# Patient Record
Sex: Male | Born: 1960 | Race: Black or African American | Hispanic: No | Marital: Single | Smoking: Current every day smoker
Health system: Southern US, Community
[De-identification: ages and names within clinical notes are randomized; demographics above are authoritative.]

## PROBLEM LIST (undated history)

## (undated) DIAGNOSIS — I82409 Acute embolism and thrombosis of unspecified deep veins of unspecified lower extremity: Secondary | ICD-10-CM

## (undated) DIAGNOSIS — M779 Enthesopathy, unspecified: Secondary | ICD-10-CM

## (undated) HISTORY — PX: HERNIA REPAIR: SHX51

## (undated) HISTORY — PX: KNEE ARTHROPLASTY: SHX992

---

## 2007-08-26 ENCOUNTER — Emergency Department (HOSPITAL_COMMUNITY): Admission: EM | Admit: 2007-08-26 | Discharge: 2007-08-26 | Payer: Self-pay | Admitting: Emergency Medicine

## 2007-10-11 ENCOUNTER — Emergency Department (HOSPITAL_COMMUNITY): Admission: EM | Admit: 2007-10-11 | Discharge: 2007-10-11 | Payer: Self-pay | Admitting: Emergency Medicine

## 2007-10-22 ENCOUNTER — Emergency Department: Payer: Self-pay | Admitting: Emergency Medicine

## 2007-10-22 ENCOUNTER — Other Ambulatory Visit: Payer: Self-pay

## 2007-11-13 ENCOUNTER — Emergency Department: Payer: Self-pay | Admitting: Emergency Medicine

## 2007-11-13 ENCOUNTER — Other Ambulatory Visit: Payer: Self-pay

## 2008-09-19 ENCOUNTER — Inpatient Hospital Stay (HOSPITAL_COMMUNITY): Admission: EM | Admit: 2008-09-19 | Discharge: 2008-09-21 | Payer: Self-pay | Admitting: Emergency Medicine

## 2008-09-20 ENCOUNTER — Ambulatory Visit: Payer: Self-pay | Admitting: Internal Medicine

## 2008-09-21 ENCOUNTER — Ambulatory Visit: Payer: Self-pay | Admitting: Gastroenterology

## 2008-09-23 ENCOUNTER — Encounter: Payer: Self-pay | Admitting: Internal Medicine

## 2008-10-29 ENCOUNTER — Emergency Department (HOSPITAL_COMMUNITY): Admission: EM | Admit: 2008-10-29 | Discharge: 2008-10-29 | Payer: Self-pay | Admitting: Emergency Medicine

## 2008-10-30 ENCOUNTER — Encounter (INDEPENDENT_AMBULATORY_CARE_PROVIDER_SITE_OTHER): Payer: Self-pay | Admitting: *Deleted

## 2008-11-26 ENCOUNTER — Ambulatory Visit: Payer: Self-pay | Admitting: Internal Medicine

## 2008-11-26 DIAGNOSIS — R1013 Epigastric pain: Secondary | ICD-10-CM

## 2008-11-26 DIAGNOSIS — R1011 Right upper quadrant pain: Secondary | ICD-10-CM | POA: Insufficient documentation

## 2008-11-26 DIAGNOSIS — K219 Gastro-esophageal reflux disease without esophagitis: Secondary | ICD-10-CM

## 2008-11-26 DIAGNOSIS — R112 Nausea with vomiting, unspecified: Secondary | ICD-10-CM

## 2008-11-27 ENCOUNTER — Encounter (HOSPITAL_COMMUNITY): Admission: RE | Admit: 2008-11-27 | Discharge: 2008-12-27 | Payer: Self-pay | Admitting: Internal Medicine

## 2008-12-03 ENCOUNTER — Encounter (INDEPENDENT_AMBULATORY_CARE_PROVIDER_SITE_OTHER): Payer: Self-pay

## 2008-12-10 ENCOUNTER — Emergency Department (HOSPITAL_COMMUNITY): Admission: EM | Admit: 2008-12-10 | Discharge: 2008-12-10 | Payer: Self-pay | Admitting: Emergency Medicine

## 2008-12-11 ENCOUNTER — Encounter (INDEPENDENT_AMBULATORY_CARE_PROVIDER_SITE_OTHER): Payer: Self-pay | Admitting: *Deleted

## 2009-02-04 ENCOUNTER — Encounter (INDEPENDENT_AMBULATORY_CARE_PROVIDER_SITE_OTHER): Payer: Self-pay | Admitting: *Deleted

## 2009-05-02 ENCOUNTER — Emergency Department (HOSPITAL_COMMUNITY): Admission: EM | Admit: 2009-05-02 | Discharge: 2009-05-02 | Payer: Self-pay | Admitting: Emergency Medicine

## 2009-12-24 ENCOUNTER — Emergency Department (HOSPITAL_COMMUNITY): Admission: EM | Admit: 2009-12-24 | Discharge: 2009-12-24 | Payer: Self-pay | Admitting: Emergency Medicine

## 2010-03-23 ENCOUNTER — Emergency Department (HOSPITAL_COMMUNITY): Admission: EM | Admit: 2010-03-23 | Discharge: 2010-03-23 | Payer: Self-pay | Admitting: Emergency Medicine

## 2010-09-16 ENCOUNTER — Emergency Department (HOSPITAL_COMMUNITY)
Admission: EM | Admit: 2010-09-16 | Discharge: 2010-09-16 | Disposition: A | Discharge status: Home / Self Care | Payer: Self-pay | Attending: Emergency Medicine | Admitting: Emergency Medicine

## 2010-09-16 DIAGNOSIS — Z8639 Personal history of other endocrine, nutritional and metabolic disease: Secondary | ICD-10-CM | POA: Insufficient documentation

## 2010-09-16 DIAGNOSIS — R109 Unspecified abdominal pain: Secondary | ICD-10-CM | POA: Insufficient documentation

## 2010-09-16 DIAGNOSIS — K219 Gastro-esophageal reflux disease without esophagitis: Secondary | ICD-10-CM | POA: Insufficient documentation

## 2010-09-16 DIAGNOSIS — Z862 Personal history of diseases of the blood and blood-forming organs and certain disorders involving the immune mechanism: Secondary | ICD-10-CM | POA: Insufficient documentation

## 2010-09-16 DIAGNOSIS — R112 Nausea with vomiting, unspecified: Secondary | ICD-10-CM | POA: Insufficient documentation

## 2010-09-16 LAB — CBC
HCT: 44.2 % (ref 39.0–52.0)
MCH: 29.9 pg (ref 26.0–34.0)
MCV: 83.1 fL (ref 78.0–100.0)
Platelets: 246 10*3/uL (ref 150–400)
RBC: 5.32 MIL/uL (ref 4.22–5.81)
WBC: 12.6 10*3/uL — ABNORMAL HIGH (ref 4.0–10.5)

## 2010-09-16 LAB — DIFFERENTIAL
Basophils Absolute: 0 10*3/uL (ref 0.0–0.1)
Basophils Relative: 0 % (ref 0–1)
Eosinophils Relative: 0 % (ref 0–5)
Lymphocytes Relative: 8 % — ABNORMAL LOW (ref 12–46)
Monocytes Relative: 3 % (ref 3–12)
Neutro Abs: 11.2 10*3/uL — ABNORMAL HIGH (ref 1.7–7.7)
Neutrophils Relative %: 89 % — ABNORMAL HIGH (ref 43–77)

## 2010-09-16 LAB — BASIC METABOLIC PANEL
BUN: 10 mg/dL (ref 6–23)
Chloride: 102 mEq/L (ref 96–112)
GFR calc Af Amer: >60 mL/min (ref >60)
Potassium: 3.8 mEq/L (ref 3.5–5.1)
Sodium: 136 mEq/L (ref 135–145)

## 2010-09-22 LAB — URINALYSIS, ROUTINE W REFLEX MICROSCOPIC
Ketones, ur: NEGATIVE mg/dL
Nitrite: NEGATIVE
Urobilinogen, UA: 0.2 mg/dL (ref 0.0–1.0)

## 2010-09-25 LAB — COMPREHENSIVE METABOLIC PANEL
Albumin: 4.3 g/dL (ref 3.5–5.2)
CO2: 24 mEq/L (ref 19–32)
Glucose, Bld: 109 mg/dL — ABNORMAL HIGH (ref 70–99)
Potassium: 3.7 mEq/L (ref 3.5–5.1)
Sodium: 139 mEq/L (ref 135–145)

## 2010-09-25 LAB — LIPASE, BLOOD: Lipase: 22 U/L (ref 11–59)

## 2010-09-25 LAB — DIFFERENTIAL
Basophils Absolute: 0 10*3/uL (ref 0.0–0.1)
Basophils Relative: 0 % (ref 0–1)
Eosinophils Absolute: 0 10*3/uL (ref 0.0–0.7)
Lymphocytes Relative: 9 % — ABNORMAL LOW (ref 12–46)
Lymphs Abs: 0.9 10*3/uL (ref 0.7–4.0)
Monocytes Relative: 2 % — ABNORMAL LOW (ref 3–12)
Neutro Abs: 8.5 10*3/uL — ABNORMAL HIGH (ref 1.7–7.7)
Neutrophils Relative %: 88 % — ABNORMAL HIGH (ref 43–77)

## 2010-09-25 LAB — CBC
Hemoglobin: 14.4 g/dL (ref 13.0–17.0)
MCV: 89.5 fL (ref 78.0–100.0)
RDW: 13.8 % (ref 11.5–15.5)
WBC: 9.6 10*3/uL (ref 4.0–10.5)

## 2010-10-17 LAB — CBC
Hemoglobin: 15.5 g/dL (ref 13.0–17.0)
RBC: 5.11 MIL/uL (ref 4.22–5.81)
RDW: 14.2 % (ref 11.5–15.5)

## 2010-10-17 LAB — COMPREHENSIVE METABOLIC PANEL
BUN: 7 mg/dL (ref 6–23)
CO2: 25 mEq/L (ref 19–32)
Calcium: 9.6 mg/dL (ref 8.4–10.5)
Chloride: 107 mEq/L (ref 96–112)
Total Protein: 6.6 g/dL (ref 6.0–8.3)

## 2010-10-17 LAB — URINALYSIS, ROUTINE W REFLEX MICROSCOPIC
Bilirubin Urine: NEGATIVE
Hgb urine dipstick: NEGATIVE
Ketones, ur: >80 mg/dL — AB
Leukocytes, UA: NEGATIVE
Protein, ur: 30 mg/dL — AB
Specific Gravity, Urine: 1.02 (ref 1.005–1.030)

## 2010-10-17 LAB — ETHANOL: Alcohol, Ethyl (B): <5 mg/dL (ref 0–10)

## 2010-10-17 LAB — URINE MICROSCOPIC-ADD ON

## 2010-10-17 LAB — DIFFERENTIAL
Basophils Relative: 0 % (ref 0–1)
Eosinophils Absolute: 0 10*3/uL (ref 0.0–0.7)
Eosinophils Relative: 0 % (ref 0–5)
Monocytes Absolute: 0.1 10*3/uL (ref 0.1–1.0)
Monocytes Relative: 2 % — ABNORMAL LOW (ref 3–12)

## 2010-10-17 LAB — LIPASE, BLOOD: Lipase: 12 U/L (ref 11–59)

## 2010-10-19 LAB — COMPREHENSIVE METABOLIC PANEL
ALT: 23 U/L (ref 0–53)
AST: 22 U/L (ref 0–37)
Albumin: 4.1 g/dL (ref 3.5–5.2)
CO2: 25 mEq/L (ref 19–32)
Calcium: 9.5 mg/dL (ref 8.4–10.5)
Chloride: 107 mEq/L (ref 96–112)
GFR calc Af Amer: >60 mL/min (ref >60)
GFR calc non Af Amer: >60 mL/min (ref >60)
Sodium: 141 mEq/L (ref 135–145)
Total Bilirubin: 0.5 mg/dL (ref 0.3–1.2)

## 2010-10-19 LAB — CBC
Platelets: 184 10*3/uL (ref 150–400)
RBC: 4.86 MIL/uL (ref 4.22–5.81)
WBC: 10.6 10*3/uL — ABNORMAL HIGH (ref 4.0–10.5)

## 2010-10-19 LAB — URINALYSIS, ROUTINE W REFLEX MICROSCOPIC
Bilirubin Urine: NEGATIVE
Hgb urine dipstick: NEGATIVE
Ketones, ur: NEGATIVE mg/dL
Protein, ur: NEGATIVE mg/dL
Urobilinogen, UA: 0.2 mg/dL (ref 0.0–1.0)

## 2010-10-19 LAB — DIFFERENTIAL
Eosinophils Absolute: 0.1 10*3/uL (ref 0.0–0.7)
Eosinophils Relative: 1 % (ref 0–5)
Lymphs Abs: 1 10*3/uL (ref 0.7–4.0)
Monocytes Absolute: 0.3 10*3/uL (ref 0.1–1.0)

## 2010-10-19 LAB — RAPID URINE DRUG SCREEN, HOSP PERFORMED
Amphetamines: NOT DETECTED
Tetrahydrocannabinol: POSITIVE — AB

## 2010-10-19 LAB — LIPASE, BLOOD: Lipase: 21 U/L (ref 11–59)

## 2010-10-20 LAB — URINALYSIS, ROUTINE W REFLEX MICROSCOPIC
Glucose, UA: NEGATIVE mg/dL
Ketones, ur: NEGATIVE mg/dL
Nitrite: NEGATIVE
pH: 6 (ref 5.0–8.0)

## 2010-10-20 LAB — COMPREHENSIVE METABOLIC PANEL
Albumin: 3.9 g/dL (ref 3.5–5.2)
Alkaline Phosphatase: 58 U/L (ref 39–117)
Alkaline Phosphatase: 69 U/L (ref 39–117)
BUN: 7 mg/dL (ref 6–23)
BUN: 9 mg/dL (ref 6–23)
Calcium: 9.8 mg/dL (ref 8.4–10.5)
Chloride: 107 mEq/L (ref 96–112)
Creatinine, Ser: 0.91 mg/dL (ref 0.4–1.5)
Creatinine, Ser: 0.92 mg/dL (ref 0.4–1.5)
Glucose, Bld: 111 mg/dL — ABNORMAL HIGH (ref 70–99)
Glucose, Bld: 95 mg/dL (ref 70–99)
Potassium: 3.6 mEq/L (ref 3.5–5.1)
Potassium: 3.8 mEq/L (ref 3.5–5.1)
Total Bilirubin: 0.7 mg/dL (ref 0.3–1.2)
Total Protein: 6.6 g/dL (ref 6.0–8.3)

## 2010-10-20 LAB — DIFFERENTIAL
Basophils Absolute: 0 10*3/uL (ref 0.0–0.1)
Basophils Relative: 0 % (ref 0–1)
Lymphocytes Relative: 11 % — ABNORMAL LOW (ref 12–46)
Lymphocytes Relative: 20 % (ref 12–46)
Lymphs Abs: 1.4 10*3/uL (ref 0.7–4.0)
Monocytes Absolute: 0.6 10*3/uL (ref 0.1–1.0)
Monocytes Relative: 5 % (ref 3–12)
Neutro Abs: 10 10*3/uL — ABNORMAL HIGH (ref 1.7–7.7)
Neutro Abs: 6.8 10*3/uL (ref 1.7–7.7)
Neutrophils Relative %: 73 % (ref 43–77)
Neutrophils Relative %: 83 % — ABNORMAL HIGH (ref 43–77)

## 2010-10-20 LAB — LIPASE, BLOOD
Lipase: 11 U/L (ref 11–59)
Lipase: 21 U/L (ref 11–59)

## 2010-10-20 LAB — CBC
HCT: 39.3 % (ref 39.0–52.0)
HCT: 43.6 % (ref 39.0–52.0)
Hemoglobin: 13.1 g/dL (ref 13.0–17.0)
MCHC: 33.6 g/dL (ref 30.0–36.0)
MCV: 88.6 fL (ref 78.0–100.0)
Platelets: 211 10*3/uL (ref 150–400)
Platelets: 236 10*3/uL (ref 150–400)
RDW: 13.8 % (ref 11.5–15.5)
RDW: 13.9 % (ref 11.5–15.5)
WBC: 9.4 10*3/uL (ref 4.0–10.5)

## 2010-10-20 LAB — RAPID URINE DRUG SCREEN, HOSP PERFORMED
Barbiturates: NOT DETECTED
Benzodiazepines: NOT DETECTED
Cocaine: NOT DETECTED

## 2010-10-20 LAB — AMYLASE: Amylase: 61 U/L (ref 27–131)

## 2010-11-22 NOTE — Consult Note (Signed)
NAMELARNIE, HEART               ACCOUNT NO.:  [id number]   MEDICAL RECORD NO.:  [id number]          PATIENT TYPE:  INP   LOCATION:  A307                          FACILITY:  [facility]   PHYSICIAN:  R. Roetta Sessions, M.D. DATE OF BIRTH:  08-28-1960   DATE OF CONSULTATION:  09/20/2008  DATE OF DISCHARGE:                                 CONSULTATION   REASON FOR CONSULTATION:  Recurrent nausea and vomiting.   HISTORY OF PRESENT ILLNESS:  Alan Cline is a pleasant 50 year old  African American male from [place] who was admitted to the hospital  yesterday with his fifth or sixth bout of cyclical vomiting he has had  over the past 1 year.  He ate some cereal yesterday morning and became  diaphoretic and started having some nausea and vomiting.  Has not really  had any significant abdominal discomfort.  Symptoms persist and he came  to the emergency department last night and saw Dr. Margretta Ditty.  He ended  up getting admitted for further evaluation and management.   He tells me he was feeling a little nauseated for a day or so prior to  onset of emesis yesterday, really has not had any hematemesis.  He has  occasional diarrhea.  No melena, hematochezia.  Evaluation in the ED  included a battery of screening labs.  His white count was initially  elevated slightly at 12.1, this morning it is 9.4.  His H and H is  normal.  His comprehensive metabolic profile looks good as well.  Amylase and lipase were normal.  CT of the abdomen and pelvis  demonstrated a hazy right lung abnormality for which followup was  recommended.   Today Alan Cline feels much better.  NG tube was attempted last night  but the staff could not pass it.  He has been afebrile.  He describes  about 5 to 6 of these episodes over the past year lasting hardly more  than 24 hours although there is a 1 or 2 day prodrome of nausea.  No  significant abdominal pain.  He has been admitted to [facility] x2  and [facility] on at least one occasion.  He describes  undergoing an EGD down at [facility] and was told he had Helicobacter pylori  and was treated.  He has been getting Nexium which he takes only  intermittently.  He has not really had any typical reflux symptoms.  No  odynophagia.  No dysphagia.  No early satiety.  He denies weight loss.  There is no family history of gastrointestinal illness including GI  neoplasia.  He has never had his lower GI tract evaluated.   He drinks an occasional beer.  He smokes marijuana weekly.  Distant  history of crack use in the late 90s but none this decade.  He smokes  cigarettes.   Alan Cline often gets diaphoretic before nausea and vomiting.  The  nausea and vomiting seem to come on within 1-2 hours of eating and he  gets somewhat diaphoretic.  He does not note any sort of temporal  relationship  between any types of foods and the onset of his symptoms.  Also, Alan Cline denies habitual hot water bathing.   PAST MEDICAL HISTORY:  Significant for left inguinal hernia repair.  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   CURRENT MEDICATIONS:  Nexium 40 mg orally daily.   ALLERGIES:  No known drug allergies.   FAMILY HISTORY:  Negative for chronic GI or liver illness.   SOCIAL HISTORY:  The patient has one healthy son.  He is single and  lives in [place].  He works part-time in a [employer].  Illicit drug  use as outlined above.  Tobacco and alcohol intake as outlined above.   REVIEW OF SYSTEMS:  No recent chest pain.  No fever or chills.  No  change in weight.  Otherwise, as in history of present illness.   PHYSICAL EXAMINATION:  GENERAL:  A very pleasant, articulate 50 year old  African American male resting comfortably.  VITAL SIGNS:  Temperature 96, pulse 56, respiratory rate 16, BP 117/60,  room air O2 is 97%.  SKIN:  Warm and dry.  HEENT:  No scleral icterus.   Conjunctivae pink.  Oral cavity no lesions.  CHEST:  Lungs clear to auscultation.  COR:  Regular rate and rhythm without murmur, gallop or rub.  ABDOMEN:  Flat, positive bowel sounds, entirely soft and nontender  without appreciable mass or organomegaly.  EXTREMITIES:  No edema.   Admission labs, amylase 61, lipase 21.  Comprehensive metabolic profile  - sodium 140, potassium 3.6, chloride 107, CO2 24, glucose 95, BUN 7,  creatinine 0.91, bilirubin 0.7, alkaline phosphatase 58, AST 18, ALT 22,  albumin 3.0, calcium 8.9.  Urine drug screen positive for opiates and  tetrahydrocannabinol.  White count was elevated at 12.1 yesterday.  It  is normal today.  There is no differential.  H and H 13.1 and 39.3.  Platelet count 211,000.   IMPRESSION:  Alan Cline is a very pleasant 50 year old African  American male with recurrent cyclical nausea and vomiting.  His workup  this admission is unrevealing for any significant pathology although he  does have a right lung lesion that deserves followup and I will defer to  his attending on that loose end.   I suspect Alan Cline has cannabinoid hyperemesis syndrome with  associated marijuana use over the past 1 year.  Other possibilities  would include but less likely occult gallbladder disease and  eosinophilic gastroenteritis.   RECOMMENDATIONS:  1. Agree with antiemetic therapy empirically.  2. Will allow clear liquid diet for now, go ahead and have a      gallbladder ultrasound.  3. Let us do a CBC but get a differential and look for eosinophils.  4. I have recommended to Alan Cline he stop using cannabis.  5. Wound continue proton pump inhibitor empirically for the time      being.  6. As a totally separate issue he probably ought to have a screening      colonoscopy at some point this year as an outpatient.  7. Further work-up of right lung abnormality on CT per hospitalist      team.  8. Further recommendations to follow.   I would  like to thank Dr. Sherle Poe for allowing me to see this nice  gentleman today.      Jonathon Bellows, M.D.  Electronically Signed     RMR/MEDQ  D:  09/20/2008  T:  09/20/2008  Job:  [id number]   cc:   Dr Ralene Muskrat FP

## 2010-11-22 NOTE — Discharge Summary (Signed)
Alan Cline, Alan Cline               ACCOUNT NO.:  [id number]   MEDICAL RECORD NO.:  [id number]          PATIENT TYPE:  INP   LOCATION:  A307                          FACILITY:  [facility]   PHYSICIAN:  Skeet Latch, DO    DATE OF BIRTH:  1961-05-12   DATE OF ADMISSION:  09/19/2008  DATE OF DISCHARGE:  03/15/2010LH                               DISCHARGE SUMMARY   INTERIM DISCHARGE SUMMARY   DISCHARGE DIAGNOSES:  1. Recurrent nausea and vomiting.  2. Cannabinoid hyperemesis syndrome.  3. History of tobacco abuse.   BRIEF HOSPITAL COURSE:  This is a 50 year old African American male who  presents with nausea and vomiting.  Patient reported eating some cereal  previously in the morning, became severely diaphoretic with nausea and  vomiting, and also had abdominal discomfort.  Patient describes the pain  as diffuse.  He would rate it 9/10, no aggravating or alleviating  factors.  When he came to the emergency room, CT scan of the abdomen and  pelvis showed some free fluid in his pelvis which is noted to be  abnormal from that but otherwise unremarkable.  Gastroenterology was  consulted.  Patient was started on antiemetic therapy.  He was placed on  a clear liquid diet.  Patient underwent an abdominal ultrasound which  was unremarkable.  Patient is to remain on IV fluids.  Patient also  remained on proton pump inhibitor.  It was felt by gastroenterology that  this could be due to cannabinoid hyperemesis syndrome associated with  marijuana use.  More possibilities include gallbladder disease and  gastroenteritis.  At this time, patient's diet will be increased with  soft mechanical diet and if he tolerates that diet with his dinner,  patient will be discharged to home.   DISCHARGE MEDICATIONS:  1. Multivitamin 1 tab daily.  2. Nexium 40 mg at bedtime.  3. Phenergan 25 mg 1 tab p.o. q.4-6 h. as needed for nausea.   VITALS ON DISCHARGE:  Temperature is 98, pulse 59, respirations 18,  blood pressure 121/73.   CONDITION ON DISCHARGE:  Stable.   DISPOSITION:  Patient will be discharged to home.   DISCHARGE INSTRUCTIONS:  Patient to maintain a soft diet and increase to  regular as tolerated.  Patient is to increase his activity slowly.  Patient is to follow up with his primary care physician in the next 3 to  5 days, sooner if  he is having problems.  Patient to take all medications as prescribed.  We will defer to his primary care physician as patient may need a follow  up with a gastroenterologist as an outpatient.  Patient is instructed to  return to the emergency room if he has any more severe nausea and  vomiting and/or call his primary care physician.      Skeet Latch, DO  Electronically Signed     SM/MEDQ  D:  09/21/2008  T:  09/21/2008  Job:  [id number]

## 2010-11-22 NOTE — H&P (Signed)
[name], Cline               ACCOUNT NO.:  [id number]   MEDICAL RECORD NO.:  [id number]          PATIENT TYPE:  INP   LOCATION:  A307                          FACILITY:  [facility]   PHYSICIAN:  Margaretmary Dys, M.D.DATE OF BIRTH:  [birth year]   DATE OF ADMISSION:  09/20/2008  DATE OF DISCHARGE:  LH                              HISTORY & PHYSICAL   ADMITTING DIAGNOSES:  1. Acute nausea and vomiting.  2. Probable acute on chronic gastritis.  3. Recent history of Helicobacter pylori diagnosis after an      esophagogastroduodenoscopy from [facility] in [place].  4. Mild dehydration.  5. History of cannabis abuse.  6. Right lung nodule.   CHIEF COMPLAINT:  Nausea and vomiting.   HISTORY OF PRESENT ILLNESS:  Alan Cline is a 50 year old male who  presented to the emergency room with severe nausea and vomiting which  started about this morning.  The patient reported eating some cereal  previously in the morning and becoming severely diaphoretic with  associated nausea and vomiting.  He also had some significant abdominal  discomfort causing him to rule up in bed.   Patient says his pain was diffuse, was about 9/10, with no aggravating  or relieving factors and appeared to be intermittent.  The patient  presented to the emergency room due to severe pain.  A CT scan of the  abdomen and pelvis obtained showed some free fluid in his pelvis which  was noted to be abnormal for a male but otherwise was unremarkable.   The patient is now being admitted for further evaluation.  Wall obtain a  gastroenterology consult and heme.   The patient had received some pain medication and reported his pain had  improved to a 4/10.  The patient reported diarrhea, which has been only  1 episode and he has no hematemesis or hematochezia.  The patient  reports having multiple episodes in the past year, prompting a visit to  [facility] at [facility], during which he had an  EGD and was told that he had  a Helicobacter pylori infection.  The patient was placed on Nexium.  He  said it did not really help him.  He has no weight loss.   He otherwise feels comfortable.  The patient has never had a  colonoscopy.   PAST MEDICAL HISTORY:  1. Left inguinal hernia.  2. History of recent EGD.  3. 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  4. History of arthroscopic knee surgery x2.   MEDICATIONS:  Nexium 40 mg p.o. once a day.   ALLERGIES:  No known drug allergies.   FAMILY HISTORY:  No history of hypertension, diabetes or any  gastrointestinal issues.   SOCIAL HISTORY:  The patient has 1 child.  He is single, lives in  [place].  He works part-time in a Engineer, manufacturing.  He smokes marijuana  occasionally.  Denies any crack cocaine use.  Patient does smoke about a  pack  of cigarettes a day.  He drinks an occasional beer.   PHYSICAL EXAM:  Patient was conscious, alert, appeared to be in some  distress, which appeared to be intermittent, was well oriented in time,  place and person.   White blood cell count was 12.1, hemoglobin of 14.7, hematocrit 43.6,  platelet count was 236 with 83% neutrophils.  Sodium was 139, potassium  was 3.8, chloride of 110, CO2 was 22.  Glucose is 111, BUN of 9,  creatinine was 0.92, AST 26, ALT of 34.  Amylase was 88, lipase was 21.  Urinalysis was negative.  Patient had a CT scan of his abdomen and  pelvis which showed some free fluid in the pelvis which was abnormal for  a male.  His CT of the lungs shows a possible 10-mm fuzzy nodule at the  right lung base, this will need to be followed in 3 months.   ASSESSMENT/PLAN:  Alan Cline is a 50 year old male who is being admitted  to the hospital with severe nausea and vomiting and a single episode of  diarrhea.   Possibilities include:  1. Acute gastritis.  2. Cannot exclude gallbladder issues.  3. Possible peptic  ulcer with gastric outlet obstruction is also a      possibility.   Plan:  1. We will admit.  2. Will hydrate with IV fluids.  3. Will keep patient n.p.o. for now.  4. Will request gastroenterology consult to see him.  5. Will continue on Protonix IV twice a day for now.  6. The patient has been informed of a right lung nodule that needs be      followed up with a CT scan, patient was advised to quit smoking.   RECOMMENDATIONS:  As per gastroenterology.      Margaretmary Dys, M.D.  Electronically Signed     AM/MEDQ  D:  09/20/2008  T:  09/20/2008  Job:  [id number]

## 2011-01-10 IMAGING — US US ABDOMEN COMPLETE
1 series · 14 of 25 positions shown · non-contrast
Comparison: None

CLINICAL DATA: Nausea, vomiting

COMPLETE ABDOMINAL ULTRASOUND
TECHNIQUE: Abdominal ultrasound examination was performed
including evaluation of the liver, gallbladder, bile ducts,
pancreas, kidneys, spleen, IVC, and abdominal aorta.

[Series 1: unknown · 0.27mm/px · 14 of 62 slices shown]
[im 1/62]
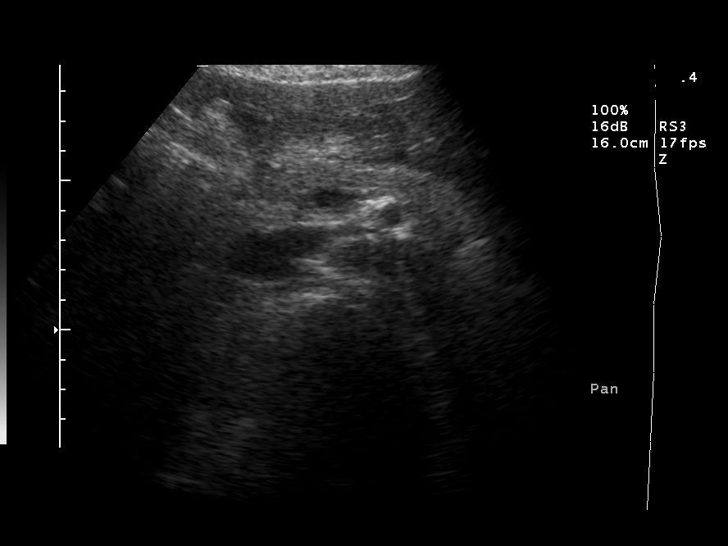
[im 6/62]
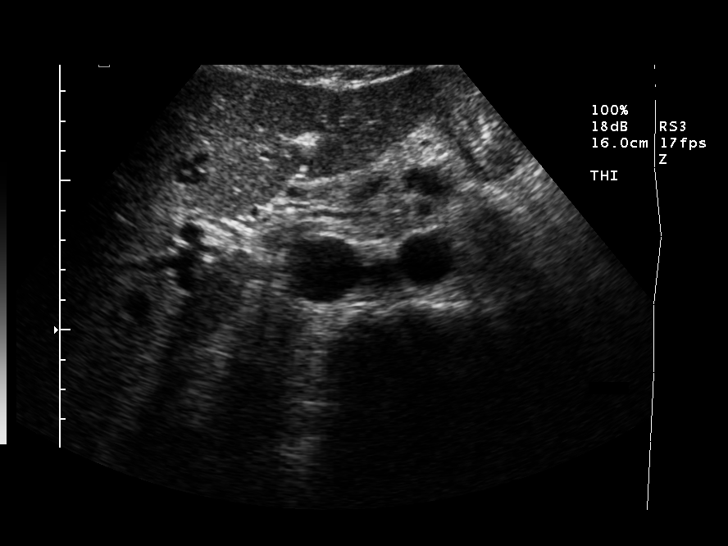
[im 11/62]
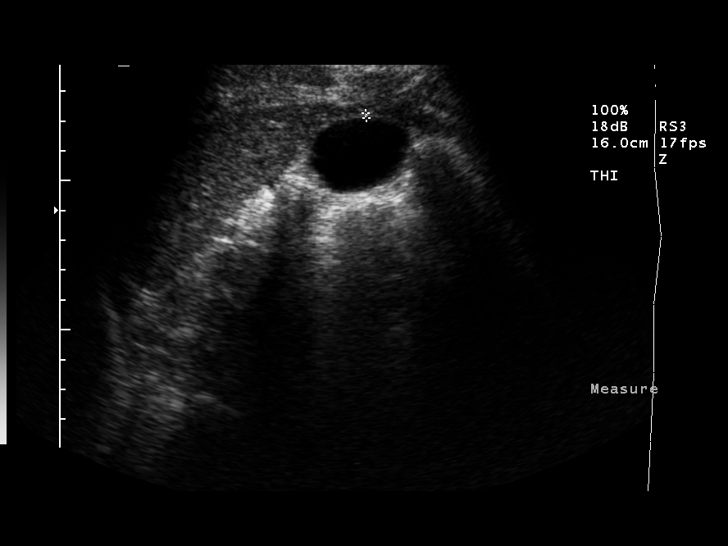
[im 16/62]
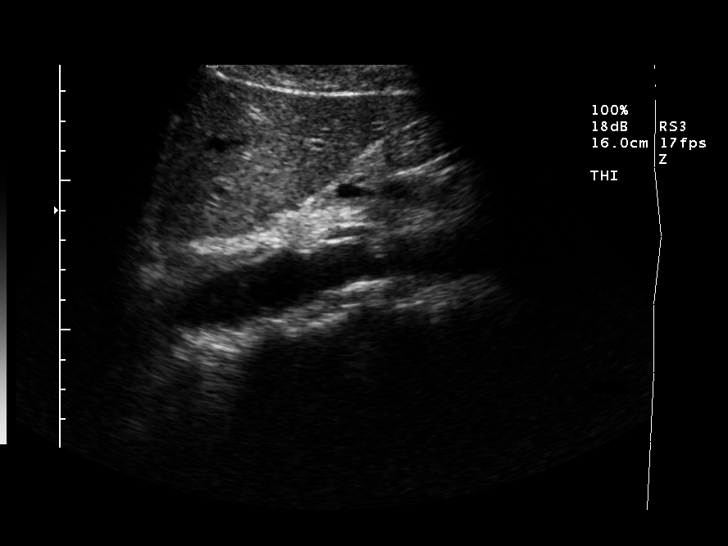
[im 21/62]
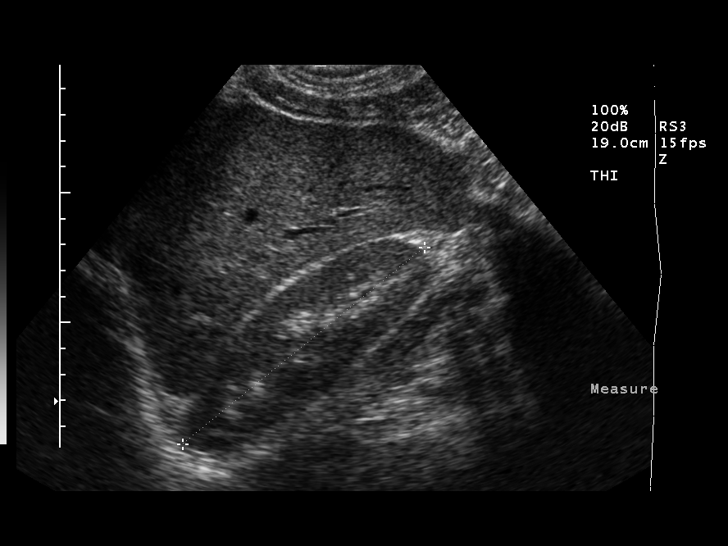
[im 23/62]
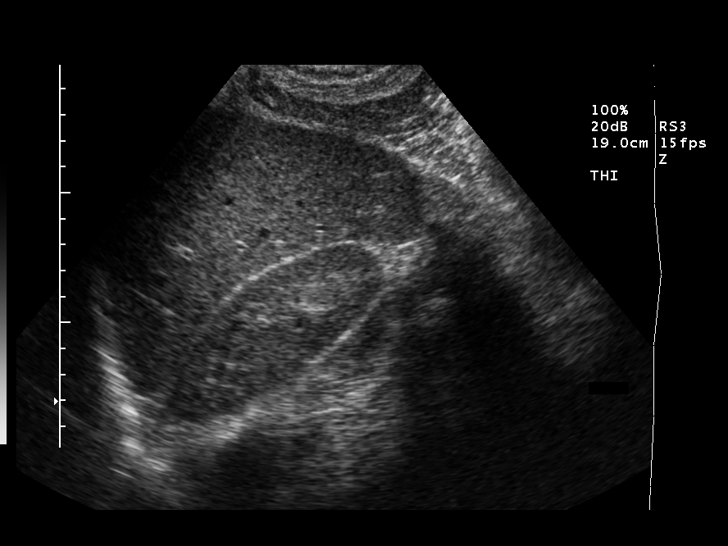
[im 28/62]
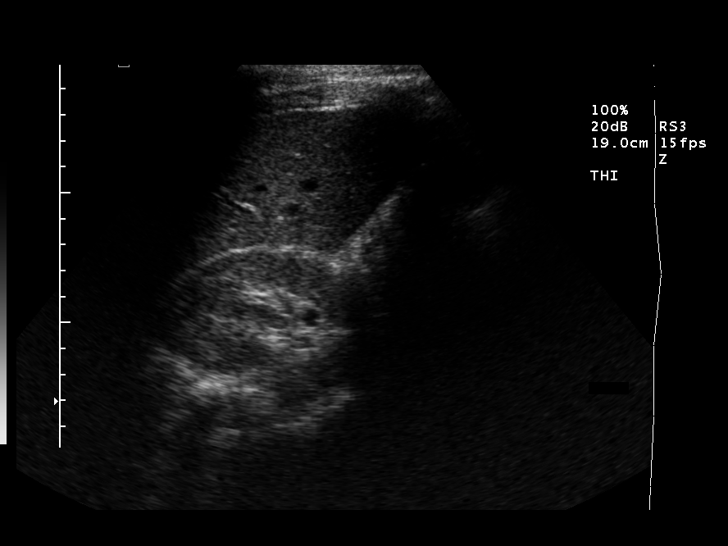
[im 34/62]
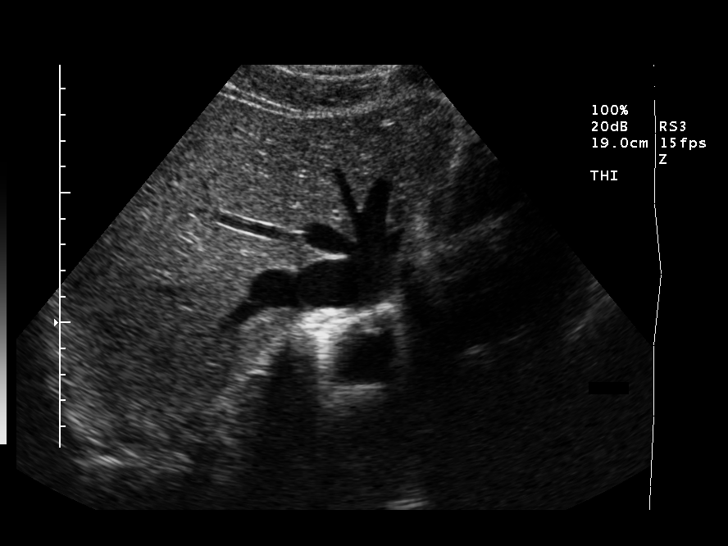
[im 39/62]
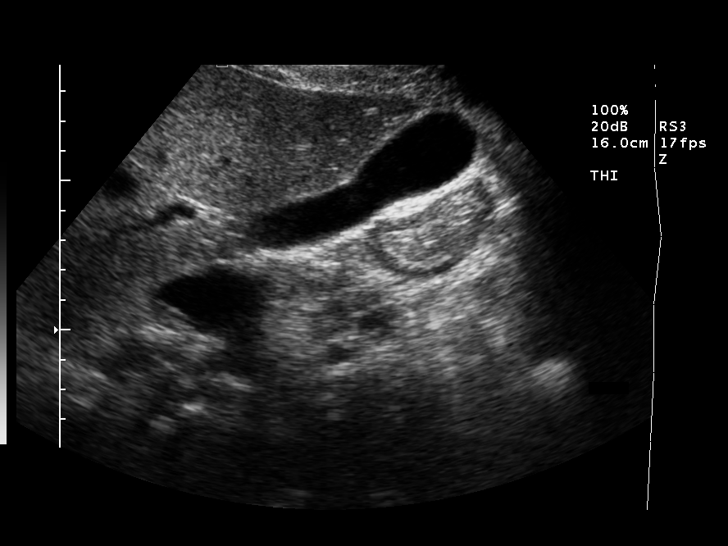
[im 41/62]
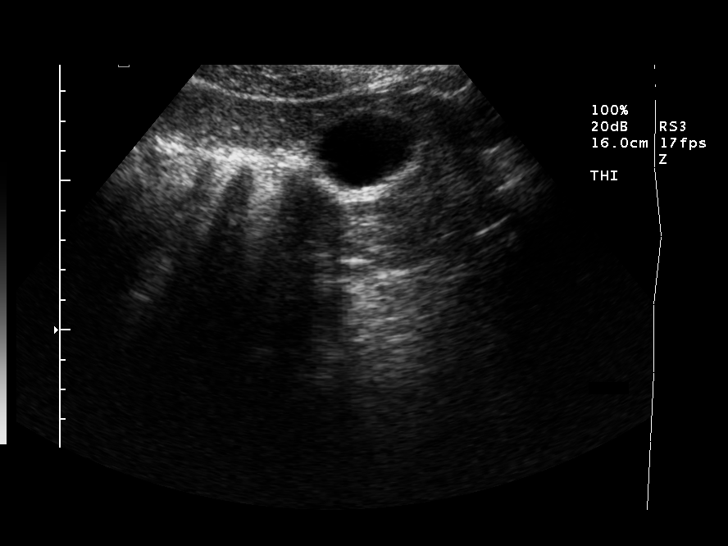
[im 46/62]
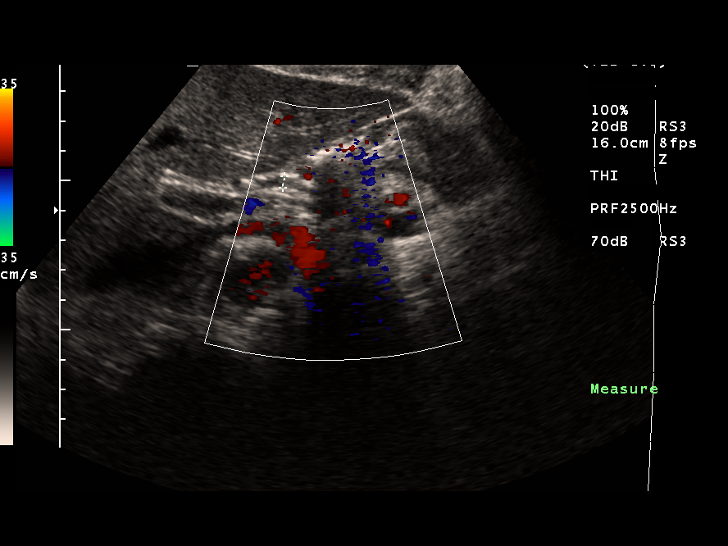
[im 51/62]
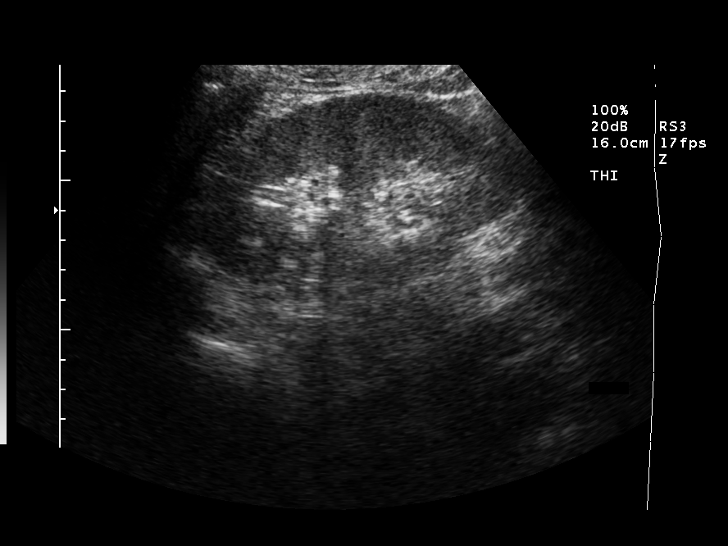
[im 56/62]
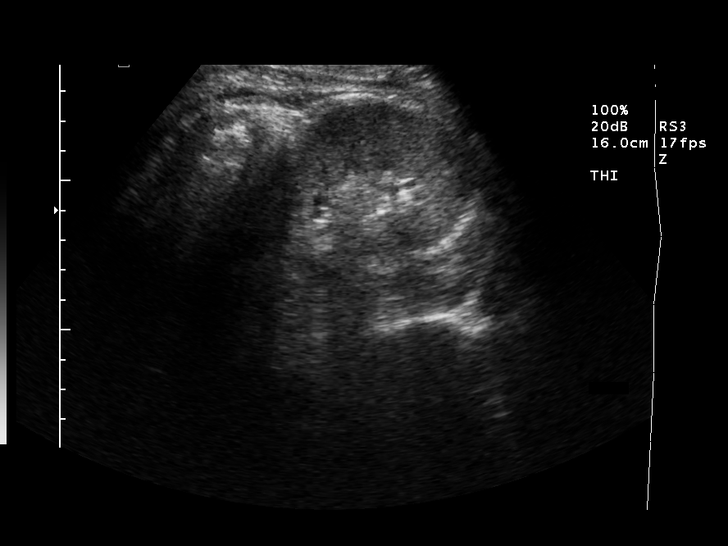
[im 62/62]
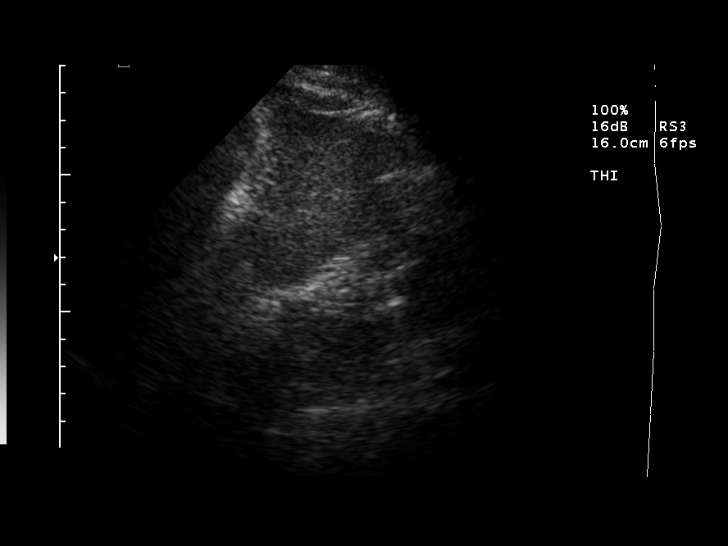

[14 of 25 positions shown; findings below may reference images not displayed]

FINDINGS: Gallbladder:  Normal without stones, wall thickening, or
pericholecystic fluid.

      Common bile duct:  4 mm diameter, normal.

      Liver:  Normal appearance.

      IVC:  Unremarkable.

      Pancreas:  Normal appearance.

      Spleen:  Normal appearance, 4.7 cm length.

      Right Kidney:  Normal size and morphology, 12.0 cm length.

      Left Kidney:  Normal size and morphology, 10.6 cm length.

      Abdominal aorta:  Mild atherosclerotic changes without
aneurysmal dilatation.
IMPRESSION: Unremarkable upper abdominal ultrasound.
Findings reviewed with Dr. Gaylene at time of interpretation.

## 2011-03-31 LAB — COMPREHENSIVE METABOLIC PANEL
Alkaline Phosphatase: 72
BUN: 11
Creatinine, Ser: 1.04
Glucose, Bld: 107 — ABNORMAL HIGH
Potassium: 3.6
Total Bilirubin: 0.8
Total Protein: 7.5

## 2011-03-31 LAB — URINALYSIS, ROUTINE W REFLEX MICROSCOPIC
Leukocytes, UA: NEGATIVE
Nitrite: NEGATIVE
Protein, ur: 30 — AB
Specific Gravity, Urine: >1.03 — ABNORMAL HIGH
Urobilinogen, UA: 0.2

## 2011-03-31 LAB — CBC
HCT: 45.7
Hemoglobin: 15.4
MCHC: 33.6
MCV: 85.8
RDW: 13.5

## 2011-03-31 LAB — DIFFERENTIAL
Basophils Absolute: 0
Basophils Relative: 0
Monocytes Relative: 10
Neutro Abs: 6.5
Neutrophils Relative %: 82 — ABNORMAL HIGH

## 2011-03-31 LAB — URINE MICROSCOPIC-ADD ON

## 2011-04-04 LAB — URINALYSIS, ROUTINE W REFLEX MICROSCOPIC
Glucose, UA: NEGATIVE
Hgb urine dipstick: NEGATIVE
Specific Gravity, Urine: 1.025

## 2011-04-04 LAB — DIFFERENTIAL
Basophils Relative: 0
Monocytes Absolute: 0.1
Monocytes Relative: 1 — ABNORMAL LOW
Neutro Abs: 9.5 — ABNORMAL HIGH

## 2011-04-04 LAB — CBC
Hemoglobin: 15.1
MCHC: 33.3
MCV: 86.4
RBC: 5.24

## 2011-04-04 LAB — BASIC METABOLIC PANEL
CO2: 26
Calcium: 9.9
Chloride: 108
GFR calc Af Amer: >60
Sodium: 141

## 2012-08-28 ENCOUNTER — Emergency Department (HOSPITAL_COMMUNITY)
Admission: EM | Admit: 2012-08-28 | Discharge: 2012-08-28 | Disposition: A | Discharge status: Home / Self Care | Payer: Medicare Other | Attending: Emergency Medicine | Admitting: Emergency Medicine

## 2012-08-28 ENCOUNTER — Encounter (HOSPITAL_COMMUNITY): Payer: Self-pay | Admitting: *Deleted

## 2012-08-28 DIAGNOSIS — M79642 Pain in left hand: Secondary | ICD-10-CM

## 2012-08-28 DIAGNOSIS — M79609 Pain in unspecified limb: Secondary | ICD-10-CM | POA: Insufficient documentation

## 2012-08-28 DIAGNOSIS — R5381 Other malaise: Secondary | ICD-10-CM | POA: Insufficient documentation

## 2012-08-28 DIAGNOSIS — Z8639 Personal history of other endocrine, nutritional and metabolic disease: Secondary | ICD-10-CM | POA: Insufficient documentation

## 2012-08-28 DIAGNOSIS — Z8739 Personal history of other diseases of the musculoskeletal system and connective tissue: Secondary | ICD-10-CM | POA: Insufficient documentation

## 2012-08-28 DIAGNOSIS — R61 Generalized hyperhidrosis: Secondary | ICD-10-CM | POA: Insufficient documentation

## 2012-08-28 DIAGNOSIS — F17200 Nicotine dependence, unspecified, uncomplicated: Secondary | ICD-10-CM | POA: Insufficient documentation

## 2012-08-28 DIAGNOSIS — G8929 Other chronic pain: Secondary | ICD-10-CM | POA: Insufficient documentation

## 2012-08-28 DIAGNOSIS — S58119A Complete traumatic amputation at level between elbow and wrist, unspecified arm, initial encounter: Secondary | ICD-10-CM | POA: Insufficient documentation

## 2012-08-28 DIAGNOSIS — Z86718 Personal history of other venous thrombosis and embolism: Secondary | ICD-10-CM | POA: Insufficient documentation

## 2012-08-28 DIAGNOSIS — Z862 Personal history of diseases of the blood and blood-forming organs and certain disorders involving the immune mechanism: Secondary | ICD-10-CM | POA: Insufficient documentation

## 2012-08-28 HISTORY — DX: Acute embolism and thrombosis of unspecified deep veins of unspecified lower extremity: I82.409

## 2012-08-28 HISTORY — DX: Enthesopathy, unspecified: M77.9

## 2012-08-28 MED ORDER — HYDROCODONE-ACETAMINOPHEN 5-325 MG PO TABS: 1.0000 | ORAL_TABLET | Freq: Four times a day (QID) | ORAL | Status: DC | PRN

## 2012-08-28 MED ORDER — IBUPROFEN 800 MG PO TABS
800.0000 mg | ORAL_TABLET | Freq: Once | ORAL | Status: AC
  Administered 2012-08-28: 800 mg via ORAL
  Filled 2012-08-28: qty 1

## 2012-08-28 NOTE — ED Provider Notes (Signed)
History    This chart was scribed for Shelda Jakes, MD by Sofie Rower, ED Scribe. The patient was seen in room APA08/APA08 and the patient's care was started at 8:01AM.     CSN: [id number]  Arrival date & time 08/28/12  6433   First MD Initiated Contact with Patient 08/28/12 0801      Chief Complaint  Patient presents with  . Hand Pain    (Consider location/radiation/quality/duration/timing/severity/associated sxs/prior treatment) Patient is a 52 y.o. male presenting with hand pain. The history is provided by the patient. No language interpreter was used.  Hand Pain This is a chronic problem. The current episode started yesterday. The problem occurs constantly. The problem has been gradually worsening. Pertinent negatives include no chest pain, no abdominal pain, no headaches and no shortness of breath. The symptoms are aggravated by bending, exertion and twisting. The symptoms are relieved by medications. Treatments tried: cortisone injection, hydrocodone. The treatment provided moderate relief.    Alan Cline is a 52 y.o. 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  Associated symptoms include diffuse weakness and sweats. The pt reports he has received a cortisone injection, three weeks ago, which provides moderate relief of his left hand pain. Additionally, the pt has taken hydrocodone which provides moderate relief of the left hand pain. Modifying factors include certain movements and positions of the left hand which intensifies the hand pain.  The pt denies headache, neck pain, back pain, chest pain, nausea, vomiting, dysuria, fevers, chills, and rash. Furthremore, the pt denies any hx of bleeding easily.   The pt is a current everyday  smoker, in addition to drinking alcohol occasionally.   PCP is Dr. Tiburcio Pea (Located in [place], [place])    Past Medical History  Diagnosis Date  . Tendonitis   . DVT (deep venous thrombosis)     Past Surgical History  Procedure Laterality Date  . Hernia repair    . Knee arthroplasty      No family history on file.  History  Substance Use Topics  . Smoking status: Current Every Day Smoker  . Smokeless tobacco: Not on file  . Alcohol Use: Yes     Comment: [facility]      Review of Systems  Constitutional: Positive for diaphoresis. Negative for fever and chills.  HENT: Negative for neck pain.   Eyes: Negative for visual disturbance.  Respiratory: Negative for shortness of breath.   Cardiovascular: Negative for chest pain.  Gastrointestinal: Negative for nausea, vomiting and abdominal pain.  Genitourinary: Negative for dysuria.  Musculoskeletal: Positive for arthralgias. Negative for back pain.  Skin: Negative for rash.  Neurological: Negative for headaches.  Hematological: Does not bruise/bleed easily.  All other systems reviewed and are negative.    Allergies  Review of patient's allergies indicates no known allergies.  Home Medications   Current Outpatient Rx  Name  Route  Sig  Dispense  Refill  . HYDROcodone-acetaminophen (NORCO/VICODIN) 5-325 MG per tablet   Oral   Take 1-2 tablets by mouth every 6 (six) hours as needed for pain.   20 tablet   0     BP 138/88  Pulse 97  Temp(Src) 98.5 F (36.9 C) (Oral)  Resp 16  Ht 6' (1.829 m)  Wt 165 lb (  74.844 kg)  BMI 22.37 kg/m2  SpO2 96%  Physical Exam  Nursing note and vitals reviewed. Constitutional: He is oriented to person, place, and time. He appears well-developed and well-nourished. No distress.  HENT:  Head: Normocephalic and atraumatic.  Eyes: EOM are normal. Pupils are equal, round, and reactive to light.  Neck: Neck supple. No tracheal deviation present.  Cardiovascular: Normal rate, regular rhythm  and normal heart sounds.  Exam reveals no gallop and no friction rub.   No murmur heard. Pulmonary/Chest: Effort normal and breath sounds normal. No respiratory distress. He has no wheezes.  Abdominal: Soft. Bowel sounds are normal. He exhibits no distension.  Musculoskeletal: Normal range of motion. He exhibits no edema.  Left wrist: Radial pulse 2+. Cap. Refill 1 sec. Fingers are all passively movable. Slight swelling at left index finger.   Neurological: He is alert and oriented to person, place, and time. No sensory deficit.  Skin: Skin is warm and dry.  Psychiatric: He has a normal mood and affect. His behavior is normal.    ED Course  Procedures (including critical care time)  DIAGNOSTIC STUDIES: Oxygen Saturation is 96% on room air, normal by my interpretation.    COORDINATION OF CARE:   8:18 AM- Treatment plan discussed with patient. Pt agrees with treatment.     Labs Reviewed - No data to display No results found.   1. Hand pain, left       MDM   Patient being followed by hand surgery in [facility] but having chronic the left hand pain part of the felt to be tendinitis of the part felt to perhaps be gout related. Patient's appear her of one week out of 4 work. We'll treat with pain medication and follow back up with [facility]. Cap refill is good no evidence of infection passive range of motion of fingers present.      .I personally performed the services described in this documentation, which was scribed in my presence. The recorded information has been reviewed and is accurate.     Shelda Jakes, MD 08/28/12 [phone]

## 2012-08-28 NOTE — ED Notes (Addendum)
Spasms to left hand, intermittent, causing deformity at times. Pt states tendonitis to the hand and is currently receiving therapy. Pt states he vomited x 1 at 0200 due to pain.

## 2012-08-28 NOTE — ED Notes (Signed)
Attempting discharge. Patient stood and walked over to counter and then sat down in floor because "my legs are weak". Patient placed in wheelchair and RN asked about patient's form of transportation to which patient stated "I drove myself here but barely". Informed that if his legs are weak he is unable to drive home. Patient called for ride. Patient placed in waiting room to await ride home.

## 2015-10-28 ENCOUNTER — Telehealth: Payer: Self-pay | Admitting: Hematology

## 2015-10-28 NOTE — Telephone Encounter (Signed)
Pt confirmed appt and provided his pcp info to obtain auth., Cheron EveryStrader, Lindsey (PCP) was contacted to obtain auth. Intake was completed; verified address and insurance

## 2015-11-01 ENCOUNTER — Other Ambulatory Visit: Payer: Self-pay | Admitting: *Deleted

## 2015-11-02 ENCOUNTER — Other Ambulatory Visit (HOSPITAL_COMMUNITY): Payer: Self-pay | Admitting: Orthopedic Surgery

## 2015-11-02 DIAGNOSIS — Z86718 Personal history of other venous thrombosis and embolism: Secondary | ICD-10-CM

## 2015-11-02 DIAGNOSIS — M1711 Unilateral primary osteoarthritis, right knee: Secondary | ICD-10-CM

## 2015-11-03 ENCOUNTER — Telehealth: Payer: Self-pay | Admitting: Hematology and Oncology

## 2015-11-03 ENCOUNTER — Ambulatory Visit: Payer: Medicare Other | Admitting: Hematology

## 2015-11-04 ENCOUNTER — Telehealth: Payer: Self-pay | Admitting: Hematology and Oncology

## 2015-11-04 ENCOUNTER — Ambulatory Visit (HOSPITAL_BASED_OUTPATIENT_CLINIC_OR_DEPARTMENT_OTHER): Payer: Medicare HMO

## 2015-11-04 ENCOUNTER — Ambulatory Visit (HOSPITAL_BASED_OUTPATIENT_CLINIC_OR_DEPARTMENT_OTHER): Payer: Medicare HMO | Admitting: Hematology and Oncology

## 2015-11-04 VITALS — BP 134/72 | HR 78 | Temp 98.3°F | Resp 18 | Ht 72.0 in | Wt 164.1 lb

## 2015-11-04 DIAGNOSIS — I82403 Acute embolism and thrombosis of unspecified deep veins of lower extremity, bilateral: Secondary | ICD-10-CM

## 2015-11-04 DIAGNOSIS — Z7901 Long term (current) use of anticoagulants: Secondary | ICD-10-CM

## 2015-11-04 DIAGNOSIS — I82431 Acute embolism and thrombosis of right popliteal vein: Secondary | ICD-10-CM

## 2015-11-04 MED ORDER — APIXABAN 5 MG PO TABS: 5.0000 mg | ORAL_TABLET | Freq: Two times a day (BID) | ORAL | Status: DC

## 2015-11-04 NOTE — Progress Notes (Signed)
Unable to get in to exam room prior to MD.  No assessment performed.  
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
## 2015-11-04 NOTE — Telephone Encounter (Signed)
Gave and printed appt sched and avs fo rpt for May °
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
## 2015-11-05 ENCOUNTER — Ambulatory Visit (HOSPITAL_COMMUNITY)
Admission: RE | Admit: 2015-11-05 | Discharge: 2015-11-05 | Disposition: A | Discharge status: Home / Self Care | Payer: Medicare HMO | Source: Ambulatory Visit | Attending: Orthopedic Surgery | Admitting: Orthopedic Surgery

## 2015-11-05 DIAGNOSIS — M1711 Unilateral primary osteoarthritis, right knee: Secondary | ICD-10-CM

## 2015-11-05 DIAGNOSIS — S83231A Complex tear of medial meniscus, current injury, right knee, initial encounter: Secondary | ICD-10-CM | POA: Diagnosis not present

## 2015-11-05 DIAGNOSIS — Z86718 Personal history of other venous thrombosis and embolism: Secondary | ICD-10-CM | POA: Insufficient documentation

## 2015-11-05 DIAGNOSIS — M23351 Other meniscus derangements, posterior horn of lateral meniscus, right knee: Secondary | ICD-10-CM | POA: Diagnosis not present

## 2015-11-05 LAB — PROTEIN S, TOTAL: Protein S, Total: 115 % (ref 60–150)

## 2015-11-05 LAB — PROTEIN C ACTIVITY: PROTEIN C ACTIVITY: 105 % (ref 73–180)

## 2015-11-05 LAB — CARDIOLIPIN ANTIBODIES, IGG, IGM, IGA: Anticardiolipin Ab,IgG,Qn: <9 GPL U/mL (ref 0–14)

## 2015-11-05 LAB — PROTEIN S ACTIVITY: PROTEIN S ACTIVITY: 139 % (ref 63–140)

## 2015-11-05 LAB — ANTITHROMBIN III: Antithrombin Activity: 141 % — ABNORMAL HIGH (ref 75–135)

## 2015-11-05 LAB — PROTEIN C, TOTAL: PROTEIN C ANTIGEN: 75 % (ref 60–150)

## 2015-11-08 ENCOUNTER — Other Ambulatory Visit: Payer: Medicare HMO

## 2015-11-08 LAB — BETA-2-GLYCOPROTEIN I ABS, IGG/M/A

## 2015-11-10 LAB — PROTHROMBIN GENE MUTATION

## 2015-11-12 LAB — FACTOR 5 LEIDEN

## 2015-11-12 LAB — LUPUS ANTICOAGULANT PANEL
DRVVT CONFIRM: 1 ratio (ref 0.8–1.2)
PTT-LA: 38.1 s (ref 0.0–43.6)
dRVVT Mix: 50.3 s — ABNORMAL HIGH (ref 0.0–47.0)
dRVVT: 58.9 s — ABNORMAL HIGH (ref 0.0–44.0)

## 2015-11-30 ENCOUNTER — Ambulatory Visit: Payer: Medicare HMO | Admitting: Hematology and Oncology

## 2018-09-14 ENCOUNTER — Other Ambulatory Visit: Payer: Self-pay

## 2018-09-14 ENCOUNTER — Emergency Department: Payer: Medicare HMO

## 2018-09-14 ENCOUNTER — Encounter: Payer: Self-pay | Admitting: Emergency Medicine

## 2018-09-14 ENCOUNTER — Emergency Department
Admission: EM | Admit: 2018-09-14 | Discharge: 2018-09-14 | Disposition: A | Discharge status: Home / Self Care | Payer: Medicare HMO | Attending: Emergency Medicine | Admitting: Emergency Medicine

## 2018-09-14 DIAGNOSIS — Y929 Unspecified place or not applicable: Secondary | ICD-10-CM | POA: Insufficient documentation

## 2018-09-14 DIAGNOSIS — Y999 Unspecified external cause status: Secondary | ICD-10-CM | POA: Insufficient documentation

## 2018-09-14 DIAGNOSIS — Z7901 Long term (current) use of anticoagulants: Secondary | ICD-10-CM | POA: Insufficient documentation

## 2018-09-14 DIAGNOSIS — Z96659 Presence of unspecified artificial knee joint: Secondary | ICD-10-CM | POA: Insufficient documentation

## 2018-09-14 DIAGNOSIS — M25552 Pain in left hip: Secondary | ICD-10-CM

## 2018-09-14 DIAGNOSIS — T148XXA Other injury of unspecified body region, initial encounter: Secondary | ICD-10-CM

## 2018-09-14 DIAGNOSIS — Y939 Activity, unspecified: Secondary | ICD-10-CM | POA: Insufficient documentation

## 2018-09-14 DIAGNOSIS — R0789 Other chest pain: Secondary | ICD-10-CM | POA: Diagnosis not present

## 2018-09-14 DIAGNOSIS — F17200 Nicotine dependence, unspecified, uncomplicated: Secondary | ICD-10-CM | POA: Diagnosis not present

## 2018-09-14 DIAGNOSIS — R0781 Pleurodynia: Secondary | ICD-10-CM

## 2018-09-14 MED ORDER — CYCLOBENZAPRINE HCL 10 MG PO TABS
5.0000 mg | ORAL_TABLET | Freq: Once | ORAL | Status: AC
  Administered 2018-09-14: 5 mg via ORAL
  Filled 2018-09-14: qty 1

## 2018-09-14 MED ORDER — CYCLOBENZAPRINE HCL 5 MG PO TABS: ORAL_TABLET | ORAL | 0 refills | Status: AC

## 2018-09-14 MED ORDER — IBUPROFEN 800 MG PO TABS: 800.0000 mg | ORAL_TABLET | Freq: Three times a day (TID) | ORAL | 0 refills | Status: AC | PRN

## 2018-09-14 MED ORDER — OXYCODONE-ACETAMINOPHEN 5-325 MG PO TABS
1.0000 | ORAL_TABLET | ORAL | Status: DC | PRN
  Administered 2018-09-14: 1 via ORAL
  Filled 2018-09-14: qty 1

## 2018-09-14 MED ORDER — IBUPROFEN 800 MG PO TABS
800.0000 mg | ORAL_TABLET | Freq: Once | ORAL | Status: AC
  Administered 2018-09-14: 800 mg via ORAL
  Filled 2018-09-14: qty 1

## 2018-09-14 MED ORDER — OXYCODONE-ACETAMINOPHEN 5-325 MG PO TABS: 1.0000 | ORAL_TABLET | ORAL | 0 refills | Status: AC | PRN

## 2018-09-14 NOTE — Discharge Instructions (Signed)
You may take medicines as needed for pain and muscle spasms (Motrin/Percocet/Flexeril #15). Apply ice to affected area several times daily. Return to the ER for worsening symptoms, persistent vomiting, difficulty breathing or other concerns.
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
